# Patient Record
Sex: Female | Born: 1975 | Race: Black or African American | Hispanic: No | Marital: Single | Smoking: Never smoker
Health system: Southern US, Community
[De-identification: ages and names within clinical notes are randomized; demographics above are authoritative.]

## PROBLEM LIST (undated history)

## (undated) DIAGNOSIS — I10 Essential (primary) hypertension: Secondary | ICD-10-CM

---

## 2007-06-01 ENCOUNTER — Emergency Department: Payer: Self-pay

## 2009-02-10 ENCOUNTER — Ambulatory Visit: Payer: Self-pay | Admitting: Family Medicine

## 2009-11-26 ENCOUNTER — Emergency Department: Payer: Self-pay | Admitting: Emergency Medicine

## 2012-11-22 ENCOUNTER — Inpatient Hospital Stay: Payer: Self-pay | Admitting: Obstetrics & Gynecology

## 2012-11-22 LAB — CBC WITH DIFFERENTIAL/PLATELET
Basophil #: 0.1 10*3/uL (ref 0.0–0.1)
Basophil %: 0.8 %
Eosinophil #: 0 10*3/uL (ref 0.0–0.7)
Eosinophil %: 0.1 %
HCT: 36.8 % (ref 35.0–47.0)
HGB: 12.4 g/dL (ref 12.0–16.0)
Lymphocyte #: 1.7 10*3/uL (ref 1.0–3.6)
Lymphocyte %: 12.9 %
MCH: 35.1 pg — ABNORMAL HIGH (ref 26.0–34.0)
MCV: 104 fL — ABNORMAL HIGH (ref 80–100)
Monocyte #: 0.8 x10 3/mm (ref 0.2–0.9)
Monocyte %: 6.6 %
Platelet: 245 10*3/uL (ref 150–440)
RBC: 3.52 10*6/uL — ABNORMAL LOW (ref 3.80–5.20)
WBC: 12.8 10*3/uL — ABNORMAL HIGH (ref 3.6–11.0)

## 2012-11-23 LAB — HEMATOCRIT: HCT: 32.7 % — ABNORMAL LOW (ref 35.0–47.0)

## 2014-02-08 ENCOUNTER — Emergency Department (HOSPITAL_COMMUNITY)
Admission: EM | Admit: 2014-02-08 | Discharge: 2014-02-08 | Disposition: A | Discharge status: Home / Self Care | Payer: Medicaid Other | Source: Home / Self Care | Attending: Emergency Medicine | Admitting: Emergency Medicine

## 2014-02-08 ENCOUNTER — Encounter (HOSPITAL_COMMUNITY): Payer: Self-pay | Admitting: Emergency Medicine

## 2014-02-08 DIAGNOSIS — J039 Acute tonsillitis, unspecified: Secondary | ICD-10-CM

## 2014-02-08 MED ORDER — AMOXICILLIN 500 MG PO CAPS: 500.0000 mg | ORAL_CAPSULE | Freq: Three times a day (TID) | ORAL | Status: AC

## 2014-02-08 NOTE — Discharge Instructions (Signed)
Tonsillitis Tonsillitis is an infection of the throat that causes the tonsils to become red, tender, and swollen. Tonsils are collections of lymphoid tissue at the back of the throat. Each tonsil has crevices (crypts). Tonsils help fight nose and throat infections and keep infection from spreading to other parts of the body for the first 18 months of life.  CAUSES Sudden (acute) tonsillitis is usually caused by infection with streptococcal bacteria. Long-lasting (chronic) tonsillitis occurs when the crypts of the tonsils become filled with pieces of food and bacteria, which makes it easy for the tonsils to become repeatedly infected. SYMPTOMS  Symptoms of tonsillitis include:  A sore throat, with possible difficulty swallowing.  White patches on the tonsils.  Fever.  Tiredness.  New episodes of snoring during sleep, when you did not snore before.  Small, foul-smelling, yellowish-white pieces of material (tonsilloliths) that you occasionally cough up or spit out. The tonsilloliths can also cause you to have bad breath. DIAGNOSIS Tonsillitis can be diagnosed through a physical exam. Diagnosis can be confirmed with the results of lab tests, including a throat culture. TREATMENT  The goals of tonsillitis treatment include the reduction of the severity and duration of symptoms and prevention of associated conditions. Symptoms of tonsillitis can be improved with the use of steroids to reduce the swelling. Tonsillitis caused by bacteria can be treated with antibiotics. Usually, treatment with antibiotics is started before the cause of the tonsillitis is known. However, if it is determined that the cause is not bacterial, antibiotics will not treat the tonsillitis. If attacks of tonsillitis are severe and frequent, your caregiver may recommend surgery to remove the tonsils (tonsillectomy). HOME CARE INSTRUCTIONS   Rest as much as possible and get plenty of sleep.  Drink plenty of fluids. While the  throat is very sore, eat soft foods or liquids, such as sherbet, soups, or instant breakfast drinks.  Eat frozen ice pops.  Gargle with a warm or cold liquid to help soothe the throat. Mix 1/4 teaspoon of salt and 1/4 teaspoon of baking soda in in 8 oz of water. SEEK MEDICAL CARE IF:   Large, tender lumps develop in your neck.  A rash develops.  A green, yellow-brown, or bloody substance is coughed up.  You are unable to swallow liquids or food for 24 hours.  You notice that only one of the tonsils is swollen. SEEK IMMEDIATE MEDICAL CARE IF:   You develop any new symptoms such as vomiting, severe headache, stiff neck, chest pain, or trouble breathing or swallowing.  You have severe throat pain along with drooling or voice changes.  You have severe pain, unrelieved with recommended medications.  You are unable to fully open the mouth.  You develop redness, swelling, or severe pain anywhere in the neck.  You have a fever. MAKE SURE YOU:   Understand these instructions.  Will watch your condition.  Will get help right away if you are not doing well or get worse. Document Released: 06/08/2005 Document Revised: 05/01/2013 Document Reviewed: 02/15/2013 [facility] Patient Information 2014 [place], [place].  Most upper respiratory infections are caused by viruses and do not require antibiotics.  We try to save the antibiotics for when we really need them to prevent bacteria from developing resistance to them.  Here are a few hints about things that can be done at home to help get over an upper respiratory infection quicker:  Get extra sleep and extra fluids.  Get 7 to 9 hours of sleep per night and  6 to 8 glasses of water a day.  Getting extra sleep keeps the immune system from getting run down.  Most people with an upper respiratory infection are a little dehydrated.  The extra fluids also keep the secretions liquified and easier to deal with.  Also, get extra vitamin C.  4000 mg per  day is the recommended dose. For the aches, headache, and fever, acetaminophen or ibuprofen are helpful.  These can be alternated every 4 hours.  People with liver disease should avoid large amounts of acetaminophen, and people with ulcer disease, gastroesophageal reflux, gastritis, congestive heart failure, chronic kidney disease, coronary artery disease and the elderly should avoid ibuprofen. For nasal congestion try Mucinex-D, or if you're having lots of sneezing or clear nasal drainage use Zyrtec-D. People with high blood pressure can take these if their blood pressure is controlled, if not, it's best to avoid the forms with a "D" (decongestants).  You can use the plain Mucinex, Allegra, Claritin, or Zyrtec even if your blood pressure is not controlled.   A Saline nasal spray such as Ocean Spray can also help.  You can add a decongestant sprays such as Afrin, but you should not use the decongestant sprays for more than 3 or 4 days since they can be habituating.  Breathe Rite nasal strips can also offer a non-drug alternative treatment to nasal congestion, especially at night. For people with symptoms of sinusitis, sleeping with your head elevated can be helpful.  For sinus pain, moist, hot compresses to the face may provide some relief.  Many people find that inhaling steam as in a shower or from a pot of steaming water can help. For any viral infection, zinc containing lozenges such as Cold-Eze or Zicam are helpful.  Zinc helps to fight viral infection.  Hot salt water gargles (8 oz of hot water, 1/2 tsp of table salt, and a pinch of baking soda) can give relief as well as hot beverages such as hot tea.  Sucrets extra strength lozenges will help the sore throat.  For the cough, take Delsym 2 tsp every 12 hours.  It has also been found recently that Aleve can help control a cough.  The dose is 1 to 2 tablets twice daily with food.  This can be combined with Delsym. (Note, if you are taking ibuprofen, you  should not take Aleve as well--take one or the other.) A cool mist vaporizer will help keep your mucous membranes from drying out.   It's important when you have an upper respiratory infection not to pass the infection to others.  This involves being very careful about the following:  Frequent hand washing or use of hand sanitizer, especially after coughing, sneezing, blowing your nose or touching your face, nose or eyes. Do not shake hands or touch anyone and try to avoid touching surfaces that other people use such as doorknobs, shopping carts, telephones and computer keyboards. Use tissues and dispose of them properly in a garbage can or ziplock bag. Cough into your sleeve. Do not let others eat or drink after you.  It's also important to recognize the signs of serious illness and get evaluated if they occur: Any respiratory infection that lasts more than 7 to 10 days.  Yellow nasal drainage and sputum are not reliable indicators of a bacterial infection, but if they last for more than 1 week, see your doctor. Fever and sore throat can indicate strep. Fever and cough can indicate influenza or pneumonia. Any kind  of severe symptom such as difficulty breathing, intractable vomiting, or severe pain should prompt you to see a doctor as soon as possible.   Your body's immune system is really the thing that will get rid of this infection.  Your immune system is comprised of 2 types of specialized cells called T cells and B cells.  T cells coordinate the array of cells in your body that engulf invading bacteria or viruses while B cells orchestrate the production of antibodies that neutralize infection.  Anything we do or any medications we give you, will just strengthen your immune system or help it clear up the infection quicker.  Here are a few helpful hints to improve your immune system to help overcome this illness or to prevent future infections:  A few vitamins can improve the health of your  immune system.  That's why your diet should include plenty of fruits, vegetables, fish, nuts, and whole grains.  Vitamin A and bet-carotene can increase the cells that fight infections (T cells and B cells).  Vitamin A is abundant in dark greens and orange vegetables such as spinach, greens, sweet potatoes, and carrots.  Vitamin B6 contributes to the maturation of white blood cells, the cells that fight disease.  Foods with vitamin B6 include cold cereal and bananas.  Vitamin C is credited with preventing colds because it increases white blood cells and also prevents cellular damage.  Citrus fruits, peaches and green and red bell peppers are all hight in vitamin C.  Vitamin E is an anti-oxidant that encourages the production of natural killer cells which reject foreign invaders and B cells that produce antibodies.  Foods high in vitamin E include wheat germ, nuts and seeds.  Foods high in omega-3 fatty acids found in foods like salmon, tuna and mackerel boost your immune system and help cells to engulf and absorb germs.  Probiotics are good bacteria that increase your T cells.  These can be found in yogurt and are available in supplements such as Culturelle or Align.  Moderate exercise increases the strength of your immune system and your ability to recover from illness.  I suggest 3 to 5 moderate intensity 30 minute workouts per week.    Sleep is another component of maintaining a strong immune system.  It enables your body to recuperate from the day's activities, stress and work.  My recommendation is to get between 7 and 9 hours of sleep per night.  If you smoke, try to quit completely or at least cut down.  Drink alcohol only in moderation if at all.  No more than 2 drinks daily for men or 1 for women.  Get a flu vaccine early in the fall or if you have not gotten one yet, once this illness has run its course.  If you are over 65, a smoker, or an asthmatic, get a pneumococcal vaccine.  My  final recommendation is to maintain a healthy weight.  Excess weight can impair the immune system by interfering with the way the immune system deals with invading viruses or bacteria.

## 2014-02-08 NOTE — ED Notes (Signed)
Patient complains of sore throat since this past Monday Denies any fever Has some white spots on her tonsils

## 2014-02-08 NOTE — ED Provider Notes (Signed)
  Chief Complaint   Chief Complaint  Patient presents with  . Sore Throat    History of Present Illness   Kelsey Adams is a 38 year old female who has had a six-day history of sore throat, cough productive yellow sputum, nasal congestion with yellow drainage, has felt fatigued and lightheaded. She denies any fever, chills, wheezing, chest pain, headache, or GI symptoms. She is here with 2 children who have similar symptoms.  Review of Systems   Other than as noted above, the patient denies any of the following symptoms: Systemic:  No fevers, chills, sweats, or myalgias. Eye:  No redness or discharge. ENT:  No ear pain, headache, nasal congestion, drainage, sinus pressure, or sore throat. Neck:  No neck pain, stiffness, or swollen glands. Lungs:  No cough, sputum production, hemoptysis, wheezing, chest tightness, shortness of breath or chest pain. GI:  No abdominal pain, nausea, vomiting or diarrhea.  PMFSH   Past medical history, family history, social history, meds, and allergies were reviewed.   Physical exam   Vital signs:  BP 132/89  Pulse 92  Temp(Src) 98.4 F (36.9 C) (Oral)  Resp 16  SpO2 100% General:  Alert and oriented.  In no distress.  Skin warm and dry. Eye:  No conjunctival injection or drainage. Lids were normal. ENT:  TMs and canals were normal, without erythema or inflammation.  Nasal mucosa was clear and uncongested, without drainage.  Mucous membranes were moist.  Tonsils were enlarged and red with spots of whitish exudate.  There were no oral ulcerations or lesions. Neck:  Supple, no adenopathy, tenderness or mass. Lungs:  No respiratory distress.  Lungs were clear to auscultation, without wheezes, rales or rhonchi.  Breath sounds were clear and equal bilaterally.  Heart:  Regular rhythm, without gallops, murmers or rubs. Skin:  Clear, warm, and dry, without rash or lesions.  Labs   Rapid strep was negative.  Assessment     The encounter  diagnosis was Tonsillitis.  Plan    1.  Meds:  The following meds were prescribed:   Discharge Medication List as of 02/08/2014 10:41 AM    START taking these medications   Details  amoxicillin (AMOXIL) 500 MG capsule Take 1 capsule (500 mg total) by mouth 3 (three) times daily., Starting 02/08/2014, Until Discontinued, Normal        2.  Patient Education/Counseling:  The patient was given appropriate handouts, self care instructions, and instructed in symptomatic relief.  Instructed to get extra fluids, rest, and use a cool mist vaporizer.    3.  Follow up:  The patient was told to follow up here if no better in 3 to 4 days, or sooner if becoming worse in any way, and given some red flag symptoms such as increasing fever, difficulty breathing, chest pain, or persistent vomiting which would prompt immediate return.  Follow up here as needed.      Reuben Likes, MD 02/08/14 [phone]

## 2014-02-10 LAB — CULTURE, GROUP A STREP

## 2014-02-10 LAB — POCT RAPID STREP A: Streptococcus, Group A Screen (Direct): NEGATIVE

## 2014-10-24 ENCOUNTER — Emergency Department (HOSPITAL_COMMUNITY)
Admission: EM | Admit: 2014-10-24 | Discharge: 2014-10-24 | Disposition: A | Discharge status: Home / Self Care | Payer: Medicaid Other | Attending: Emergency Medicine | Admitting: Emergency Medicine

## 2014-10-24 ENCOUNTER — Emergency Department (HOSPITAL_COMMUNITY): Payer: Medicaid Other

## 2014-10-24 ENCOUNTER — Encounter (HOSPITAL_COMMUNITY): Payer: Self-pay | Admitting: *Deleted

## 2014-10-24 DIAGNOSIS — Z792 Long term (current) use of antibiotics: Secondary | ICD-10-CM | POA: Insufficient documentation

## 2014-10-24 DIAGNOSIS — R0789 Other chest pain: Secondary | ICD-10-CM | POA: Insufficient documentation

## 2014-10-24 DIAGNOSIS — R52 Pain, unspecified: Secondary | ICD-10-CM

## 2014-10-24 DIAGNOSIS — I10 Essential (primary) hypertension: Secondary | ICD-10-CM | POA: Insufficient documentation

## 2014-10-24 HISTORY — DX: Essential (primary) hypertension: I10

## 2014-10-24 LAB — I-STAT CHEM 8, ED
BUN: 9 mg/dL (ref 6–23)
Calcium, Ion: 1.13 mmol/L (ref 1.12–1.23)
Chloride: 101 mmol/L (ref 96–112)
Creatinine, Ser: 0.8 mg/dL (ref 0.50–1.10)
Glucose, Bld: 89 mg/dL (ref 70–99)
HEMATOCRIT: 45 % (ref 36.0–46.0)
Hemoglobin: 15.3 g/dL — ABNORMAL HIGH (ref 12.0–15.0)
Potassium: 3.1 mmol/L — ABNORMAL LOW (ref 3.5–5.1)
Sodium: 141 mmol/L (ref 135–145)
TCO2: 24 mmol/L (ref 0–100)

## 2014-10-24 LAB — CBC WITH DIFFERENTIAL/PLATELET
BASOS ABS: 0 10*3/uL (ref 0.0–0.1)
BASOS PCT: 1 % (ref 0–1)
EOS ABS: 0.1 10*3/uL (ref 0.0–0.7)
EOS PCT: 2 % (ref 0–5)
HCT: 40.6 % (ref 36.0–46.0)
Hemoglobin: 14 g/dL (ref 12.0–15.0)
LYMPHS PCT: 56 % — AB (ref 12–46)
Lymphs Abs: 3.8 10*3/uL (ref 0.7–4.0)
MCH: 34.4 pg — AB (ref 26.0–34.0)
MCHC: 34.5 g/dL (ref 30.0–36.0)
MCV: 99.8 fL (ref 78.0–100.0)
Monocytes Absolute: 0.5 10*3/uL (ref 0.1–1.0)
Monocytes Relative: 7 % (ref 3–12)
Neutro Abs: 2.4 10*3/uL (ref 1.7–7.7)
Neutrophils Relative %: 36 % — ABNORMAL LOW (ref 43–77)
PLATELETS: 166 10*3/uL (ref 150–400)
RBC: 4.07 MIL/uL (ref 3.87–5.11)
RDW: 12.2 % (ref 11.5–15.5)
WBC: 6.8 10*3/uL (ref 4.0–10.5)

## 2014-10-24 LAB — D-DIMER, QUANTITATIVE: D-Dimer, Quant: 0.33 ug/mL-FEU (ref 0.00–0.48)

## 2014-10-24 LAB — I-STAT TROPONIN, ED: TROPONIN I, POC: 0 ng/mL (ref 0.00–0.08)

## 2014-10-24 LAB — PREGNANCY, URINE: Preg Test, Ur: NEGATIVE

## 2014-10-24 NOTE — ED Provider Notes (Signed)
CSN: [id number]     Arrival date & time 10/24/14  4098 History   First MD Initiated Contact with Patient 10/24/14 [phone]     Chief Complaint  Patient presents with  . Chest Pain     (Consider location/radiation/quality/duration/timing/severity/associated sxs/prior Treatment) HPI [name] is a 39 year old female with past medical history of hypertension who presents the ER complaining of chest discomfort. Patient reports being woken up out of sleep with right sided chest discomfort which she describes as a "squeezing in her chest". Patient reported some mild shortness of breath with it. Patient states the discomfort persisted, and was unchanging for approximately an hour and a half until she arrived in the ER. Patient states on arrival her pain mostly subsided. She reports that movement, pulling herself up with her right arm, leaning forward exacerbates the pain. She states sitting still alleviates the pain. Patient reports the pain initially was a 10 out of 10, has not subsided to 1 or 2 out of 10 spontaneously. She denies any associated lightheadedness dizziness, nausea, vomiting.  Past Medical History  Diagnosis Date  . Hypertension    History reviewed. No pertinent past surgical history. No family history on file. History  Substance Use Topics  . Smoking status: Never Smoker   . Smokeless tobacco: Never Used  . Alcohol Use: 0.6 oz/week    1 Glasses of wine per week     Comment: occassional   OB History    No data available     Review of Systems  Constitutional: Negative for fever.  HENT: Negative for trouble swallowing.   Eyes: Negative for visual disturbance.  Respiratory: Positive for shortness of breath.   Cardiovascular: Positive for chest pain.  Gastrointestinal: Negative for nausea, vomiting and abdominal pain.  Genitourinary: Negative for dysuria.  Musculoskeletal: Negative for neck pain.  Skin: Negative for rash.  Neurological: Negative for dizziness,  weakness and numbness.  Psychiatric/Behavioral: Negative.       Allergies  Review of patient's allergies indicates no known allergies.  Home Medications   Prior to Admission medications   Medication Sig Start Date End Date Taking? Authorizing Provider  amoxicillin (AMOXIL) 500 MG capsule Take 1 capsule (500 mg total) by mouth 3 (three) times daily. Patient not taking: Reported on 10/24/2014 02/08/14   Reuben Likes, MD  aspirin 325 MG tablet Take 650 mg by mouth every 6 (six) hours as needed for mild pain.   Yes Historical Provider, MD   BP 128/84 mmHg  Pulse 69  Temp(Src) 98.3 F (36.8 C) (Oral)  Resp 21  SpO2 99% Physical Exam  Constitutional: She is oriented to person, place, and time. She appears well-developed and well-nourished. No distress.  HENT:  Head: Normocephalic and atraumatic.  Mouth/Throat: Oropharynx is clear and moist. No oropharyngeal exudate.  Eyes: Right eye exhibits no discharge. Left eye exhibits no discharge. No scleral icterus.  Neck: Normal range of motion.  Cardiovascular: Normal rate, regular rhythm, S1 normal, S2 normal and normal heart sounds.   No murmur heard. Pulses:      Radial pulses are 2+ on the right side, and 2+ on the left side.  Pulmonary/Chest: Effort normal and breath sounds normal. No accessory muscle usage. No tachypnea. No respiratory distress.    Abdominal: Soft. There is no tenderness.  Musculoskeletal: Normal range of motion. She exhibits no edema or tenderness.  Neurological: She is alert and oriented to person, place, and time. No cranial nerve deficit. Coordination normal.  Skin: Skin is warm  and dry. No rash noted. She is not diaphoretic.  Psychiatric: She has a normal mood and affect.  Nursing note and vitals reviewed.   ED Course  Procedures (including critical care time) Labs Review Labs Reviewed  CBC WITH DIFFERENTIAL/PLATELET - Abnormal; Notable for the following:    MCH 34.4 (*)    Neutrophils Relative % 36  (*)    Lymphocytes Relative 56 (*)    All other components within normal limits  I-STAT CHEM 8, ED - Abnormal; Notable for the following:    Potassium 3.1 (*)    Hemoglobin 15.3 (*)    All other components within normal limits  PREGNANCY, URINE  D-DIMER, QUANTITATIVE  PREGNANCY, URINE  I-STAT TROPOININ, ED    Imaging Review Dg Chest 2 View  10/24/2014   CLINICAL DATA:  Chest pain and short of breath tonight.  EXAM: CHEST  2 VIEW  COMPARISON:  None.  FINDINGS: Cardiomediastinal silhouette is unremarkable. The lungs are clear without pleural effusions or focal consolidations. Trachea projects midline and there is no pneumothorax. Soft tissue planes and included osseous structures are non-suspicious.  IMPRESSION: Normal chest.   Electronically Signed   By: Awilda Metroourtnay  Bloomer   On: 10/24/2014 05:30     EKG Interpretation   Date/Time:  Friday October 24 2014 04:38:13 EST Ventricular Rate:  92 PR Interval:  161 QRS Duration: 93 QT Interval:  372 QTC Calculation: 460 R Axis:   98 Text Interpretation:  Sinus rhythm Confirmed by [facility] HospitalAL[facility]-RASCH  MD, APRIL  ([id number]) on 10/24/2014 4:44:09 AM      MDM   Final diagnoses:  Right-sided chest wall pain    Patient here with atypical chest pain. Pain is reproducible when patient is pulling herself up in the stretcher, and reproducible to palpation in the right side of her upper chest suggestive of a musculoskeletal etiology. 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 EKG unremarkable for injury or ectopy. Chest x-ray without evidence of acute pathology. Labs reassuring. With atypical chest pain, negative troponin and negative d-dimer, there is no concern for ACS, PE, PNA. Heart score 1. Patient discharged and strongly encouraged follow-up with her primary care provider. I discussed return precautions with patient, and patient verbalizes understanding and agreement of this  plan. I encouraged patient to call or return to the ER should she have any questions or concerns.  BP 128/84 mmHg  Pulse 69  Temp(Src) 98.3 F (36.8 C) (Oral)  Resp 21  SpO2 99%  Signed,  Ladona MowJoe Marshella Tello, PA-C 9:23 AM  Patient discussed with Dr. Cy BlamerApril Palumbo, MD.   Monte FantasiaJoseph W Yonael Tulloch, PA-C 10/24/14 [phone]  April K Palumbo-Rasch, MD 10/24/14 [id number]

## 2014-10-24 NOTE — ED Notes (Signed)
Pt arrives c/o right sided chest pain that feels like a squeezing sensation. 10/10. Denies any n/v, dizziness, or lightheadedness. Pt took ASA 81mg  x2.

## 2014-10-24 NOTE — Discharge Instructions (Signed)
Chest Wall Pain °Chest wall pain is pain in or around the bones and muscles of your chest. It may take up to 6 weeks to get better. It may take longer if you must stay physically active in your work and activities.  °CAUSES  °Chest wall pain may happen on its own. However, it may be caused by: °· A viral illness like the flu. °· Injury. °· Coughing. °· Exercise. °· Arthritis. °· Fibromyalgia. °· Shingles. °HOME CARE INSTRUCTIONS  °· Avoid overtiring physical activity. Try not to strain or perform activities that cause pain. This includes any activities using your chest or your abdominal and side muscles, especially if heavy weights are used. °· Put ice on the sore area. °¨ Put ice in a plastic bag. °¨ Place a towel between your skin and the bag. °¨ Leave the ice on for 15-20 minutes per hour while awake for the first 2 days. °· Only take over-the-counter or prescription medicines for pain, discomfort, or fever as directed by your caregiver. °SEEK IMMEDIATE MEDICAL CARE IF:  °· Your pain increases, or you are very uncomfortable. °· You have a fever. °· Your chest pain becomes worse. °· You have new, unexplained symptoms. °· You have nausea or vomiting. °· You feel sweaty or lightheaded. °· You have a cough with phlegm (sputum), or you cough up blood. °MAKE SURE YOU:  °· Understand these instructions. °· Will watch your condition. °· Will get help right away if you are not doing well or get worse. °Document Released: 08/29/2005 Document Revised: 11/21/2011 Document Reviewed: 04/25/2011 °[employer]® Patient Information ©2015 [employer]. This information is not intended to replace advice given to you by your health care provider. Make sure you discuss any questions you have with your health care provider. ° °Chest Pain (Nonspecific) °It is often hard to give a specific diagnosis for the cause of chest pain. There is always a chance that your pain could be related to something serious, such as a heart attack or a blood  clot in the lungs. You need to follow up with your health care provider for further evaluation. °CAUSES  °· Heartburn. °· Pneumonia or bronchitis. °· Anxiety or stress. °· Inflammation around your heart (pericarditis) or lung (pleuritis or pleurisy). °· A blood clot in the lung. °· A collapsed lung (pneumothorax). It can develop suddenly on its own (spontaneous pneumothorax) or from trauma to the chest. °· Shingles infection (herpes zoster virus). °The chest wall is composed of bones, muscles, and cartilage. Any of these can be the source of the pain. °· The bones can be bruised by injury. °· The muscles or cartilage can be strained by coughing or overwork. °· The cartilage can be affected by inflammation and become sore (costochondritis). °DIAGNOSIS  °Lab tests or other studies may be needed to find the cause of your pain. Your health care provider may have you take a test called an ambulatory electrocardiogram (ECG). An ECG records your heartbeat patterns over a 24-hour period. You may also have other tests, such as: °· Transthoracic echocardiogram (TTE). During echocardiography, sound waves are used to evaluate how blood flows through your heart. °· Transesophageal echocardiogram (TEE). °· Cardiac monitoring. This allows your health care provider to monitor your heart rate and rhythm in real time. °· Holter monitor. This is a portable device that records your heartbeat and can help diagnose heart arrhythmias. It allows your health care provider to track your heart activity for several days, if needed. °· Stress tests by   exercise or by giving medicine that makes the heart beat faster. °TREATMENT  °· Treatment depends on what may be causing your chest pain. Treatment may include: °¨ Acid blockers for heartburn. °¨ Anti-inflammatory medicine. °¨ Pain medicine for inflammatory conditions. °¨ Antibiotics if an infection is present. °· You may be advised to change lifestyle habits. This includes stopping smoking and  avoiding alcohol, caffeine, and chocolate. °· You may be advised to keep your head raised (elevated) when sleeping. This reduces the chance of acid going backward from your stomach into your esophagus. °Most of the time, nonspecific chest pain will improve within 2-3 days with rest and mild pain medicine.  °HOME CARE INSTRUCTIONS  °· If antibiotics were prescribed, take them as directed. Finish them even if you start to feel better. °· For the next few days, avoid physical activities that bring on chest pain. Continue physical activities as directed. °· Do not use any tobacco products, including cigarettes, chewing tobacco, or electronic cigarettes. °· Avoid drinking alcohol. °· Only take medicine as directed by your health care provider. °· Follow your health care provider's suggestions for further testing if your chest pain does not go away. °· Keep any follow-up appointments you made. If you do not go to an appointment, you could develop lasting (chronic) problems with pain. If there is any problem keeping an appointment, call to reschedule. °SEEK MEDICAL CARE IF:  °· Your chest pain does not go away, even after treatment. °· You have a rash with blisters on your chest. °· You have a fever. °SEEK IMMEDIATE MEDICAL CARE IF:  °· You have increased chest pain or pain that spreads to your arm, neck, jaw, back, or abdomen. °· You have shortness of breath. °· You have an increasing cough, or you cough up blood. °· You have severe back or abdominal pain. °· You feel nauseous or vomit. °· You have severe weakness. °· You faint. °· You have chills. °This is an emergency. Do not wait to see if the pain will go away. Get medical help at once. Call your local emergency services (911 in [place] Do not drive yourself to the hospital. °MAKE SURE YOU:  °· Understand these instructions. °· Will watch your condition. °· Will get help right away if you are not doing well or get worse. °Document Released: 06/08/2005 Document Revised:  09/03/2013 Document Reviewed: 04/03/2008 °[employer]® Patient Information ©2015 [employer]. This information is not intended to replace advice given to you by your health care provider. Make sure you discuss any questions you have with your health care provider. ° °

## 2016-05-05 IMAGING — CR DG CHEST 2V
2 series · 2 of 2 positions shown · non-contrast
Comparison: None.

CLINICAL DATA: Chest pain and short of breath tonight.

EXAM:
CHEST  2 VIEW

[w chest pa]
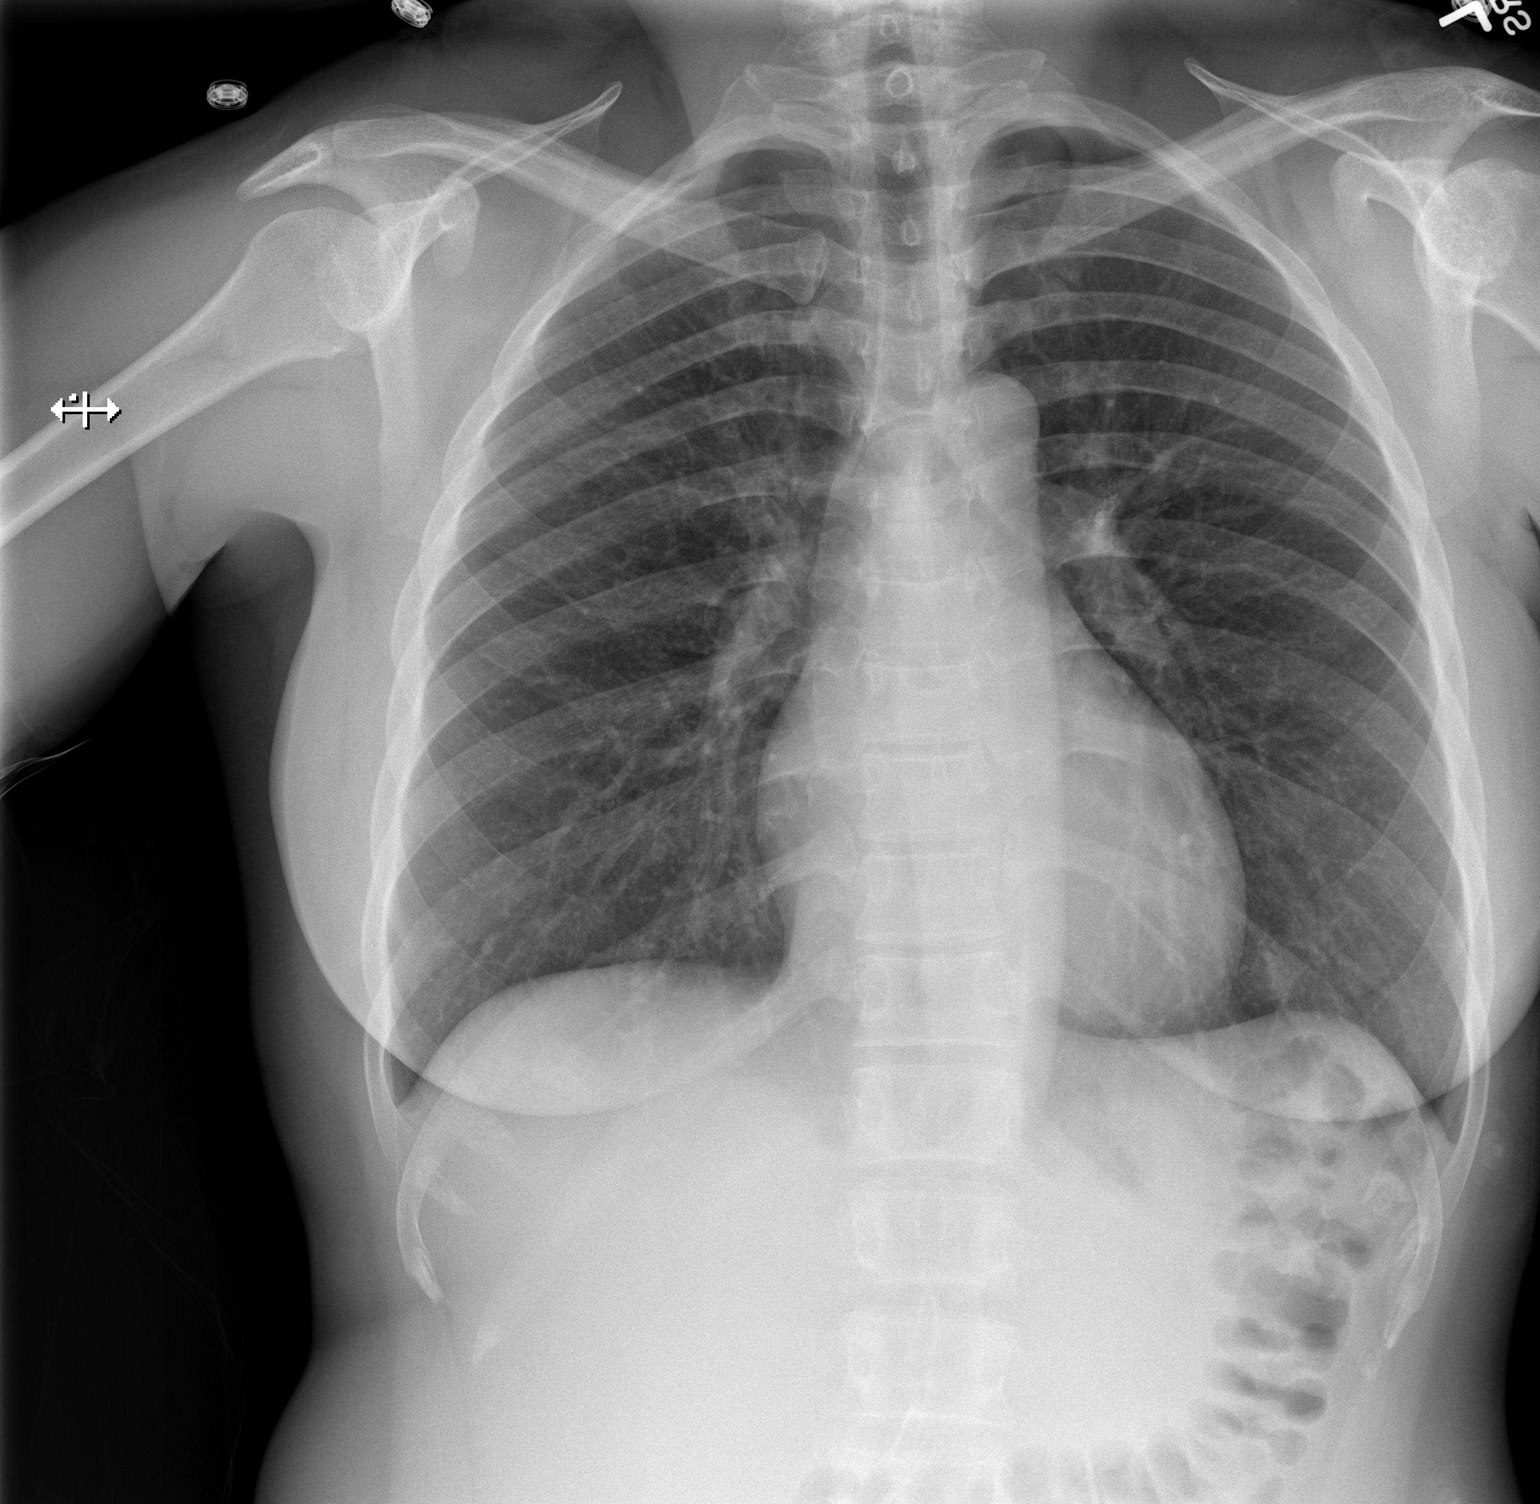

[w chest lat]
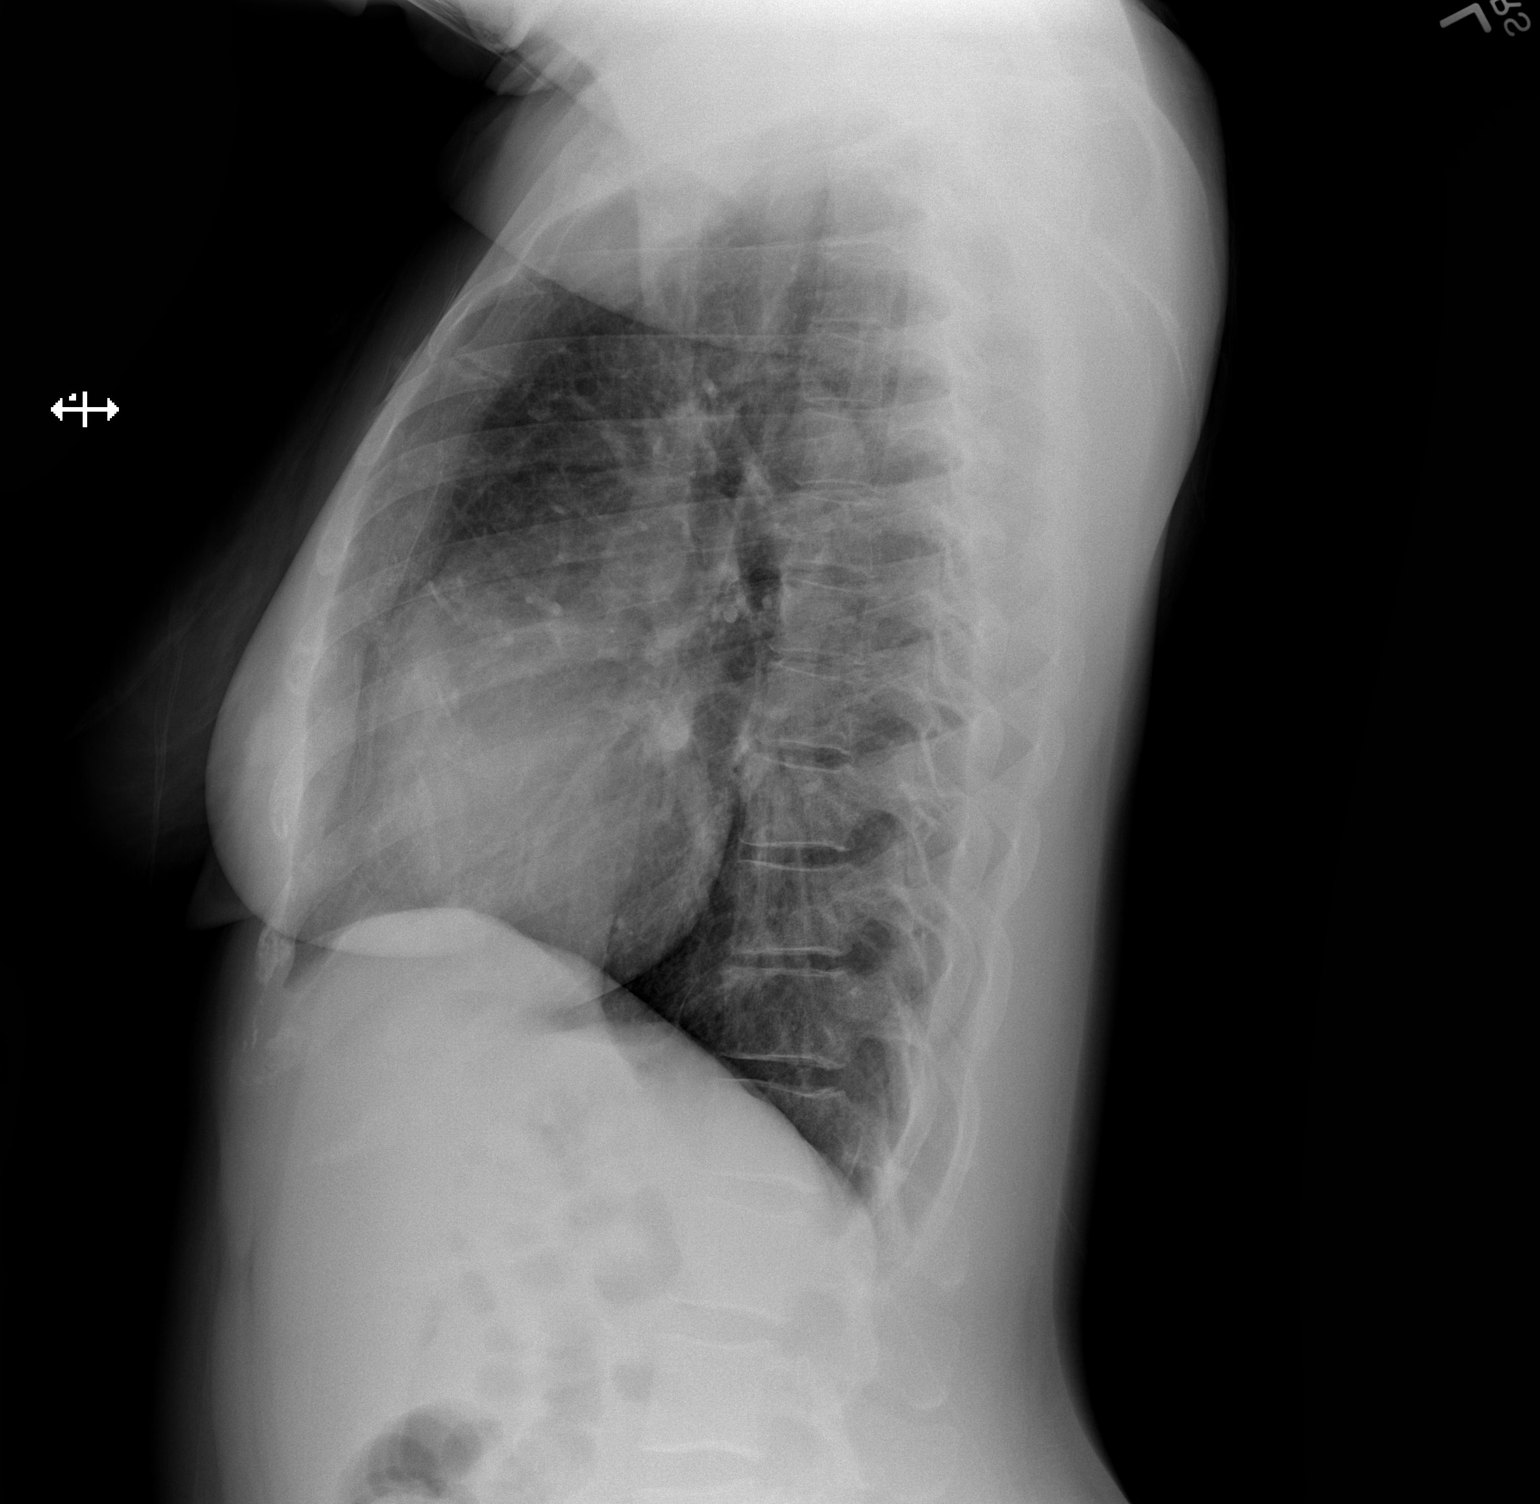

[2 of 2 positions shown; findings below may reference images not displayed]

FINDINGS: Cardiomediastinal silhouette is unremarkable. The lungs are clear
without pleural effusions or focal consolidations. Trachea projects
midline and there is no pneumothorax. Soft tissue planes and
included osseous structures are non-suspicious.
IMPRESSION: Normal chest.

  By: Kees Jan Marijn
